# Patient Record
Sex: Female | Born: 1975 | Race: White | Hispanic: No | Marital: Married | Smoking: Never smoker
Health system: Southern US, Community
[De-identification: ages and names within clinical notes are randomized; demographics above are authoritative.]

## PROBLEM LIST (undated history)

## (undated) DIAGNOSIS — C801 Malignant (primary) neoplasm, unspecified: Secondary | ICD-10-CM

---

## 2015-09-07 NOTE — Patient Instructions (Signed)
Katherine Walker  09/07/2015   Your procedure is scheduled on: September 12, 2015  Report to [facility]  Entrance take [facility]  elevators to 3rd floor to  [place] at 9:15 AM.  Call this number if you have problems the morning of surgery [phone]   Remember: ONLY 1 PERSON MAY GO WITH YOU TO SHORT STAY TO GET  READY MORNING OF [place].  Do not eat food or drink liquids :After Midnight.     Take these medicines the morning of surgery with A SIP OF WATER: None                                You may not have any metal on your body including hair pins and              piercings  Do not wear jewelry, make-up, lotions, powders or perfumes, deodorant             Do not wear nail polish.  Do not shave  48 hours prior to surgery.            Do not bring valuables to the hospital. [place].  Contacts, dentures or bridgework may not be worn into surgery.  Leave suitcase in the car. After surgery it may be brought to your room.       Special Instructions: coughing and deep breathing exercises, leg exercises              Please read over the following fact sheets you were given: _____________________________________________________________________             [facility] - Preparing for Surgery Before surgery, you can play an important role.  Because skin is not sterile, your skin needs to be as free of germs as possible.  You can reduce the number of germs on your skin by washing with CHG (chlorahexidine gluconate) soap before surgery.  CHG is an antiseptic cleaner which kills germs and bonds with the skin to continue killing germs even after washing. Please DO NOT use if you have an allergy to CHG or antibacterial soaps.  If your skin becomes reddened/irritated stop using the CHG and inform your nurse when you arrive at Short Stay. Do not shave (including legs and underarms) for at least 48 hours prior to  the first CHG shower.  You may shave your face/neck. Please follow these instructions carefully:  1.  Shower with CHG Soap the night before surgery and the  morning of Surgery.  2.  If you choose to wash your hair, wash your hair first as usual with your  normal  shampoo.  3.  After you shampoo, rinse your hair and body thoroughly to remove the  shampoo.                           4.  Use CHG as you would any other liquid soap.  You can apply chg directly  to the skin and wash                       Gently with a scrungie or clean washcloth.  5.  Apply the  CHG Soap to your body ONLY FROM THE NECK DOWN.   Do not use on face/ open                           Wound or open sores. Avoid contact with eyes, ears mouth and genitals (private parts).                       Wash face,  Genitals (private parts) with your normal soap.             6.  Wash thoroughly, paying special attention to the area where your surgery  will be performed.  7.  Thoroughly rinse your body with warm water from the neck down.  8.  DO NOT shower/wash with your normal soap after using and rinsing off  the CHG Soap.                9.  Pat yourself dry with a clean towel.            10.  Wear clean pajamas.            11.  Place clean sheets on your bed the night of your first shower and do not  sleep with pets. Day of Surgery : Do not apply any lotions/deodorants the morning of surgery.  Please wear clean clothes to the hospital/surgery center.  FAILURE TO FOLLOW THESE INSTRUCTIONS MAY RESULT IN THE CANCELLATION OF YOUR SURGERY PATIENT SIGNATURE_________________________________  NURSE SIGNATURE__________________________________  ________________________________________________________________________

## 2015-09-08 ENCOUNTER — Ambulatory Visit: Payer: Self-pay | Admitting: Surgery

## 2015-09-08 NOTE — H&P (Signed)
[facility]  Meta Hatchet. Katherine Walker DOB: [birth year] Married / Language: English / Race: White Female  History of Present Illness  The patient is a 40 year old female who presents with a thyroid nodule.  Patient is referred by Dr. Francetta Found for thyroid neoplasm of uncertain significance. Patient noted on self-examination a mass in the anterior neck in January 2017. She was seen by her primary care physician and underwent an ultrasound. This showed bilateral thyroid nodules with a dominant nodule in the right measuring 3.2 cm, complex, containing microcalcifications. Subsequent fine-needle aspiration biopsy was performed and showed a follicular lesion of undetermined significance. Additional testing has not yet been performed. Patient has no prior history of head or neck surgery. She has never been on thyroid medication. There is no family history of thyroid disease and specifically no history of thyroid cancer. There is no family history of other endocrine neoplasms. Patient denies any compressive symptoms. She denies tremors or palpitations.   Other Problems No pertinent past medical history  Past Surgical History  No pertinent past surgical history  Diagnostic Studies History  Colonoscopy never Mammogram 1-3 years ago Pap Smear 1-5 years ago  Allergies No Known Drug Allergies02/20/2017  Medication History  No Current Medications Medications Reconciled  Social History Alcohol use Occasional alcohol use. Caffeine use Coffee, Tea. No drug use Tobacco use Never smoker.  Family History  Breast Cancer Family Members In General. Cancer Family Members In General. Diabetes Mellitus Family Members In General. Hypertension Family Members In General.  Pregnancy / Birth History Age at menarche 45 years. Contraceptive History Oral contraceptives. Gravida 3 Maternal age 34-25 Para 3 Regular periods  Review of  Systems General Not Present- Appetite Loss, Chills, Fatigue, Fever, Night Sweats, Weight Gain and Weight Loss. Skin Not Present- Change in Wart/Mole, Dryness, Hives, Jaundice, New Lesions, Non-Healing Wounds, Rash and Ulcer. HEENT Not Present- Earache, Hearing Loss, Hoarseness, Nose Bleed, Oral Ulcers, Ringing in the Ears, Seasonal Allergies, Sinus Pain, Sore Throat, Visual Disturbances, Wears glasses/contact lenses and Yellow Eyes. Respiratory Not Present- Bloody sputum, Chronic Cough, Difficulty Breathing, Snoring and Wheezing. Breast Not Present- Breast Mass, Breast Pain, Nipple Discharge and Skin Changes. Cardiovascular Not Present- Chest Pain, Difficulty Breathing Lying Down, Leg Cramps, Palpitations, Rapid Heart Rate, Shortness of Breath and Swelling of Extremities. Gastrointestinal Not Present- Abdominal Pain, Bloating, Bloody Stool, Change in Bowel Habits, Chronic diarrhea, Constipation, Difficulty Swallowing, Excessive gas, Gets full quickly at meals, Hemorrhoids, Indigestion, Nausea, Rectal Pain and Vomiting. Female Genitourinary Not Present- Frequency, Nocturia, Painful Urination, Pelvic Pain and Urgency. Musculoskeletal Not Present- Back Pain, Joint Pain, Joint Stiffness, Muscle Pain, Muscle Weakness and Swelling of Extremities. Neurological Not Present- Decreased Memory, Fainting, Headaches, Numbness, Seizures, Tingling, Tremor, Trouble walking and Weakness. Psychiatric Not Present- Anxiety, Bipolar, Change in Sleep Pattern, Depression, Fearful and Frequent crying. Endocrine Not Present- Cold Intolerance, Excessive Hunger, Hair Changes, Heat Intolerance, Hot flashes and New Diabetes. Hematology Not Present- Easy Bruising, Excessive bleeding, Gland problems, HIV and Persistent Infections.  Vitals Weight: 157.8 lb Height: 65in Body Surface Area: 1.79 m Body Mass Index: 26.26 kg/m  Temp.: 97.34F  Pulse: 66 (Regular)  BP: 134/86 (Sitting, Left Arm, Standard)   Physical  Exam  General - appears comfortable, no distress; not diaphorectic  HEENT - normocephalic; sclerae clear, gaze conjugate; mucous membranes moist, dentition good; voice normal  Neck - asymmetric on extension; no palpable anterior or posterior cervical adenopathy; dominant mass anterior right thyroid lobe, approximately 3 cm in  size, smooth, mobile, nontender; left thyroid lobe without dominant mass, slight nodularity, nontender  Chest - clear bilaterally without rhonchi, rales, or wheeze  Cor - regular rhythm with normal rate; no significant murmur  Ext - non-tender without significant edema or lymphedema  Neuro - grossly intact; no tremor   Assessment & Plan  NEOPLASM OF UNCERTAIN BEHAVIOR OF THYROID GLAND (D44.0) MULTIPLE THYROID NODULES (E04.2)  Patient is referred by her endocrinologist for evaluation of multiple thyroid nodules with a dominant nodule in the right thyroid lobe representing a thyroid neoplasm of undetermined significance. Patient is provided with written literature on thyroid surgery to review at home.  We discussed options for management including proceeding with a repeat biopsy for molecular genetic studies. Alternatively, we could proceed with thyroidectomy. We discussed the procedure of total thyroidectomy at length. We discussed risks and benefits of the procedure including risk of injury to recurrent laryngeal nerves and the parathyroid glands. We discussed the hospital stay to be anticipated. We discussed the need for lifelong thyroid hormone replacement. We discussed the potential need for radioactive iodine treatment. We discussed the location of the surgical incision and the cosmetic results to be anticipated. Patient and her husband understand and wish to proceed with surgery in the near future.  The risks and benefits of the procedure have been discussed at length with the patient. The patient understands the proposed procedure, potential alternative  treatments, and the course of recovery to be expected. All of the patient's questions have been answered at this time. The patient wishes to proceed with surgery.  Earnstine Regal, MD, [facility] Office: [phone]

## 2015-09-08 NOTE — Progress Notes (Signed)
Patient needs orders in epic by 400 pm today or will be moved to depot. Pre op is 09-11-15, surgery 09-12-15. Thanks for your help.

## 2015-09-10 ENCOUNTER — Encounter (HOSPITAL_COMMUNITY): Payer: Self-pay | Admitting: Surgery

## 2015-09-10 DIAGNOSIS — D440 Neoplasm of uncertain behavior of thyroid gland: Secondary | ICD-10-CM | POA: Diagnosis present

## 2015-09-10 DIAGNOSIS — E042 Nontoxic multinodular goiter: Secondary | ICD-10-CM | POA: Diagnosis present

## 2015-09-10 NOTE — H&P (Signed)
[facility]  Katherine Walker. Thrall DOB: 05/22/1976 Married / Language: English / Race: White Female  History of Present Illness  The patient is a 40 year old female who presents with a thyroid nodule.  Patient is referred by Katherine Walker for thyroid neoplasm of uncertain significance. Patient noted on self-examination a mass in the anterior neck in January 2017. She was seen by her primary care physician and underwent an ultrasound. This showed bilateral thyroid nodules with a dominant nodule in the right measuring 3.2 cm, complex, containing microcalcifications. Subsequent fine-needle aspiration biopsy was performed and showed a follicular lesion of undetermined significance. Additional testing has not yet been performed. Patient has no prior history of head or neck Walker. She has never been on thyroid medication. There is no family history of thyroid disease and specifically no history of thyroid cancer. There is no family history of other endocrine neoplasms. Patient denies any compressive symptoms. She denies tremors or palpitations.   Other Problems No pertinent past medical history  Past Surgical History No pertinent past surgical history  Diagnostic Studies History Colonoscopy never Mammogram 1-3 years ago Pap Smear 1-5 years ago  Allergies No Known Drug Allergies02/20/2017  Medication History No Current Medications Medications Reconciled  Social History Alcohol use Occasional alcohol use. Caffeine use Coffee, Tea. No drug use Tobacco use Never smoker.  Family History Breast Cancer Family Members In General. Cancer Family Members In General. Diabetes Mellitus Family Members In General. Hypertension Family Members In General.  Pregnancy / Birth History Age at menarche 71 years. Contraceptive History Oral contraceptives. Gravida 3 Maternal age 87-25 Para 3 Regular periods  Review of  Systems General Not Present- Appetite Loss, Chills, Fatigue, Fever, Night Sweats, Weight Gain and Weight Loss. Skin Not Present- Change in Wart/Mole, Dryness, Hives, Jaundice, New Lesions, Non-Healing Wounds, Rash and Ulcer. HEENT Not Present- Earache, Hearing Loss, Hoarseness, Nose Bleed, Oral Ulcers, Ringing in the Ears, Seasonal Allergies, Sinus Pain, Sore Throat, Visual Disturbances, Wears glasses/contact lenses and Yellow Eyes. Respiratory Not Present- Bloody sputum, Chronic Cough, Difficulty Breathing, Snoring and Wheezing. Breast Not Present- Breast Mass, Breast Pain, Nipple Discharge and Skin Changes. Cardiovascular Not Present- Chest Pain, Difficulty Breathing Lying Down, Leg Cramps, Palpitations, Rapid Heart Rate, Shortness of Breath and Swelling of Extremities. Gastrointestinal Not Present- Abdominal Pain, Bloating, Bloody Stool, Change in Bowel Habits, Chronic diarrhea, Constipation, Difficulty Swallowing, Excessive gas, Gets full quickly at meals, Hemorrhoids, Indigestion, Nausea, Rectal Pain and Vomiting. Female Genitourinary Not Present- Frequency, Nocturia, Painful Urination, Pelvic Pain and Urgency. Musculoskeletal Not Present- Back Pain, Joint Pain, Joint Stiffness, Muscle Pain, Muscle Weakness and Swelling of Extremities. Neurological Not Present- Decreased Memory, Fainting, Headaches, Numbness, Seizures, Tingling, Tremor, Trouble walking and Weakness. Psychiatric Not Present- Anxiety, Bipolar, Change in Sleep Pattern, Depression, Fearful and Frequent crying. Endocrine Not Present- Cold Intolerance, Excessive Hunger, Hair Changes, Heat Intolerance, Hot flashes and New Diabetes. Hematology Not Present- Easy Bruising, Excessive bleeding, Gland problems, HIV and Persistent Infections.  Vitals  Weight: 157.8 lb Height: 65in Body Surface Area: 1.79 m Body Mass Index: 26.26 kg/m  Temp.: 97.5F  Pulse: 66 (Regular)  BP: 134/86 (Sitting, Left Arm, Standard)  Physical  Exam  General - appears comfortable, no distress; not diaphorectic  HEENT - normocephalic; sclerae clear, gaze conjugate; mucous membranes moist, dentition good; voice normal  Neck - asymmetric on extension; no palpable anterior or posterior cervical adenopathy; dominant mass anterior right thyroid lobe, approximately 3 cm in size, smooth, mobile, nontender;  left thyroid lobe without dominant mass, slight nodularity, nontender  Chest - clear bilaterally without rhonchi, rales, or wheeze  Cor - regular rhythm with normal rate; no significant murmur  Ext - non-tender without significant edema or lymphedema  Neuro - grossly intact; no tremor   Assessment & Plan  NEOPLASM OF UNCERTAIN BEHAVIOR OF THYROID GLAND (D44.0) MULTIPLE THYROID NODULES (E04.2)  Patient is referred by her endocrinologist for evaluation of multiple thyroid nodules with a dominant nodule in the right thyroid lobe representing a thyroid neoplasm of undetermined significance. Patient is provided with written literature on thyroid Walker to review at home.  We discussed options for management including proceeding with a repeat biopsy for molecular genetic studies. Alternatively, we could proceed with thyroidectomy. We discussed the procedure of total thyroidectomy at length. We discussed risks and benefits of the procedure including risk of injury to recurrent laryngeal nerves and the parathyroid glands. We discussed the hospital stay to be anticipated. We discussed the need for lifelong thyroid hormone replacement. We discussed the potential need for radioactive iodine treatment. We discussed the location of the surgical incision and the cosmetic results to be anticipated. Patient and her husband understand and wish to proceed with Walker in the near future.  The risks and benefits of the procedure have been discussed at length with the patient. The patient understands the proposed procedure, potential alternative  treatments, and the course of recovery to be expected. All of the patient's questions have been answered at this time. The patient wishes to proceed with Walker.  Katherine Regal, MD, [facility] Office: [phone]

## 2015-09-11 ENCOUNTER — Ambulatory Visit (HOSPITAL_COMMUNITY)
Admission: RE | Admit: 2015-09-11 | Discharge: 2015-09-11 | Disposition: A | Discharge status: Home / Self Care | Payer: 59 | Source: Ambulatory Visit | Attending: Anesthesiology | Admitting: Anesthesiology

## 2015-09-11 ENCOUNTER — Encounter (HOSPITAL_COMMUNITY): Payer: Self-pay

## 2015-09-11 ENCOUNTER — Encounter (HOSPITAL_COMMUNITY)
Admission: RE | Admit: 2015-09-11 | Discharge: 2015-09-11 | Disposition: A | Discharge status: Home / Self Care | Payer: 59 | Source: Ambulatory Visit | Attending: Surgery | Admitting: Surgery

## 2015-09-11 DIAGNOSIS — Z01818 Encounter for other preprocedural examination: Secondary | ICD-10-CM | POA: Diagnosis not present

## 2015-09-11 HISTORY — DX: Malignant (primary) neoplasm, unspecified: C80.1

## 2015-09-11 LAB — HCG, SERUM, QUALITATIVE: PREG SERUM: NEGATIVE

## 2015-09-11 NOTE — Progress Notes (Signed)
08-22-15 - CBC & BMP labs - in chart

## 2015-09-12 ENCOUNTER — Observation Stay (HOSPITAL_COMMUNITY)
Admission: RE | Admit: 2015-09-12 | Discharge: 2015-09-13 | Disposition: A | Discharge status: Home / Self Care | Payer: 59 | Source: Ambulatory Visit | Attending: Surgery | Admitting: Surgery

## 2015-09-12 ENCOUNTER — Ambulatory Visit (HOSPITAL_COMMUNITY): Payer: 59 | Admitting: Certified Registered"

## 2015-09-12 ENCOUNTER — Encounter (HOSPITAL_COMMUNITY)
Admission: RE | Disposition: A | Discharge status: Home / Self Care | Payer: Self-pay | Source: Ambulatory Visit | Attending: Surgery

## 2015-09-12 ENCOUNTER — Encounter (HOSPITAL_COMMUNITY): Payer: Self-pay | Admitting: *Deleted

## 2015-09-12 DIAGNOSIS — E042 Nontoxic multinodular goiter: Secondary | ICD-10-CM | POA: Diagnosis present

## 2015-09-12 DIAGNOSIS — D440 Neoplasm of uncertain behavior of thyroid gland: Secondary | ICD-10-CM | POA: Diagnosis present

## 2015-09-12 DIAGNOSIS — C73 Malignant neoplasm of thyroid gland: Secondary | ICD-10-CM | POA: Diagnosis not present

## 2015-09-12 HISTORY — PX: THYROIDECTOMY: SHX17

## 2015-09-12 SURGERY — THYROIDECTOMY
Anesthesia: General

## 2015-09-12 MED ORDER — PROMETHAZINE HCL 25 MG/ML IJ SOLN: 6.2500 mg | INTRAMUSCULAR | Status: DC | PRN

## 2015-09-12 MED ORDER — ROCURONIUM BROMIDE 100 MG/10ML IV SOLN
INTRAVENOUS | Status: AC
  Filled 2015-09-12: qty 1

## 2015-09-12 MED ORDER — HYDROMORPHONE HCL 1 MG/ML IJ SOLN
0.2500 mg | INTRAMUSCULAR | Status: DC | PRN
  Administered 2015-09-12 (×2): 0.5 mg via INTRAVENOUS

## 2015-09-12 MED ORDER — ONDANSETRON HCL 4 MG/2ML IJ SOLN
4.0000 mg | Freq: Four times a day (QID) | INTRAMUSCULAR | Status: DC | PRN
  Filled 2015-09-12: qty 2

## 2015-09-12 MED ORDER — ACETAMINOPHEN 10 MG/ML IV SOLN
INTRAVENOUS | Status: DC | PRN
  Administered 2015-09-12: 1000 mg via INTRAVENOUS

## 2015-09-12 MED ORDER — ONDANSETRON HCL 4 MG/2ML IJ SOLN
INTRAMUSCULAR | Status: AC
  Filled 2015-09-12: qty 4

## 2015-09-12 MED ORDER — ROCURONIUM BROMIDE 100 MG/10ML IV SOLN
INTRAVENOUS | Status: DC | PRN
  Administered 2015-09-12: 20 mg via INTRAVENOUS
  Administered 2015-09-12: 30 mg via INTRAVENOUS
  Administered 2015-09-12: 20 mg via INTRAVENOUS

## 2015-09-12 MED ORDER — KCL IN DEXTROSE-NACL 20-5-0.45 MEQ/L-%-% IV SOLN
INTRAVENOUS | Status: DC
  Administered 2015-09-12 – 2015-09-13 (×2): via INTRAVENOUS
  Filled 2015-09-12 (×2): qty 1000

## 2015-09-12 MED ORDER — PROPOFOL 10 MG/ML IV BOLUS
INTRAVENOUS | Status: AC
  Filled 2015-09-12: qty 20

## 2015-09-12 MED ORDER — DEXAMETHASONE SODIUM PHOSPHATE 10 MG/ML IJ SOLN
INTRAMUSCULAR | Status: DC | PRN
  Administered 2015-09-12: 10 mg via INTRAVENOUS

## 2015-09-12 MED ORDER — SUGAMMADEX SODIUM 200 MG/2ML IV SOLN
INTRAVENOUS | Status: DC | PRN
  Administered 2015-09-12: 150 mg via INTRAVENOUS

## 2015-09-12 MED ORDER — HYDROCODONE-ACETAMINOPHEN 5-325 MG PO TABS: 1.0000 | ORAL_TABLET | ORAL | Status: DC | PRN

## 2015-09-12 MED ORDER — ACETAMINOPHEN 325 MG PO TABS: 650.0000 mg | ORAL_TABLET | Freq: Four times a day (QID) | ORAL | Status: DC | PRN

## 2015-09-12 MED ORDER — LIDOCAINE HCL (CARDIAC) 20 MG/ML IV SOLN
INTRAVENOUS | Status: DC | PRN
  Administered 2015-09-12: 100 mg via INTRAVENOUS

## 2015-09-12 MED ORDER — MIDAZOLAM HCL 5 MG/5ML IJ SOLN
INTRAMUSCULAR | Status: DC | PRN
  Administered 2015-09-12: 2 mg via INTRAVENOUS

## 2015-09-12 MED ORDER — PROPOFOL 10 MG/ML IV BOLUS
INTRAVENOUS | Status: DC | PRN
  Administered 2015-09-12: 170 mg via INTRAVENOUS

## 2015-09-12 MED ORDER — 0.9 % SODIUM CHLORIDE (POUR BTL) OPTIME
TOPICAL | Status: DC | PRN
  Administered 2015-09-12: 1000 mL

## 2015-09-12 MED ORDER — FENTANYL CITRATE (PF) 250 MCG/5ML IJ SOLN
INTRAMUSCULAR | Status: AC
  Filled 2015-09-12: qty 5

## 2015-09-12 MED ORDER — FENTANYL CITRATE (PF) 100 MCG/2ML IJ SOLN
INTRAMUSCULAR | Status: DC | PRN
  Administered 2015-09-12 (×3): 50 ug via INTRAVENOUS
  Administered 2015-09-12 (×2): 25 ug via INTRAVENOUS
  Administered 2015-09-12 (×3): 50 ug via INTRAVENOUS

## 2015-09-12 MED ORDER — DEXAMETHASONE SODIUM PHOSPHATE 10 MG/ML IJ SOLN
INTRAMUSCULAR | Status: AC
  Filled 2015-09-12: qty 1

## 2015-09-12 MED ORDER — ONDANSETRON HCL 4 MG/2ML IJ SOLN
INTRAMUSCULAR | Status: DC | PRN
  Administered 2015-09-12 (×2): 2 mg via INTRAVENOUS
  Administered 2015-09-12: 4 mg via INTRAVENOUS

## 2015-09-12 MED ORDER — FENTANYL CITRATE (PF) 100 MCG/2ML IJ SOLN
INTRAMUSCULAR | Status: AC
  Filled 2015-09-12: qty 2

## 2015-09-12 MED ORDER — HYDROMORPHONE HCL 1 MG/ML IJ SOLN
1.0000 mg | INTRAMUSCULAR | Status: DC | PRN
  Administered 2015-09-12: 1 mg via INTRAVENOUS
  Filled 2015-09-12: qty 1

## 2015-09-12 MED ORDER — SUCCINYLCHOLINE CHLORIDE 20 MG/ML IJ SOLN
INTRAMUSCULAR | Status: DC | PRN
  Administered 2015-09-12: 100 mg via INTRAVENOUS

## 2015-09-12 MED ORDER — MIDAZOLAM HCL 2 MG/2ML IJ SOLN
INTRAMUSCULAR | Status: AC
  Filled 2015-09-12: qty 2

## 2015-09-12 MED ORDER — LACTATED RINGERS IV SOLN
INTRAVENOUS | Status: DC
  Administered 2015-09-12: 13:00:00 via INTRAVENOUS
  Administered 2015-09-12: 1000 mL via INTRAVENOUS

## 2015-09-12 MED ORDER — PROMETHAZINE HCL 25 MG/ML IJ SOLN
12.5000 mg | INTRAMUSCULAR | Status: DC | PRN
  Administered 2015-09-12: 12.5 mg via INTRAVENOUS
  Filled 2015-09-12: qty 1

## 2015-09-12 MED ORDER — ACETAMINOPHEN 650 MG RE SUPP: 650.0000 mg | Freq: Four times a day (QID) | RECTAL | Status: DC | PRN

## 2015-09-12 MED ORDER — CEFAZOLIN SODIUM-DEXTROSE 2-3 GM-% IV SOLR
INTRAVENOUS | Status: AC
  Filled 2015-09-12: qty 50

## 2015-09-12 MED ORDER — HYDROMORPHONE HCL 1 MG/ML IJ SOLN
INTRAMUSCULAR | Status: AC
  Filled 2015-09-12: qty 1

## 2015-09-12 MED ORDER — LIDOCAINE HCL (CARDIAC) 20 MG/ML IV SOLN
INTRAVENOUS | Status: AC
  Filled 2015-09-12: qty 5

## 2015-09-12 MED ORDER — CEFAZOLIN SODIUM-DEXTROSE 2-3 GM-% IV SOLR
2.0000 g | INTRAVENOUS | Status: AC
  Administered 2015-09-12: 2 g via INTRAVENOUS

## 2015-09-12 MED ORDER — ONDANSETRON 4 MG PO TBDP: 4.0000 mg | ORAL_TABLET | Freq: Four times a day (QID) | ORAL | Status: DC | PRN

## 2015-09-12 MED ORDER — SUGAMMADEX SODIUM 200 MG/2ML IV SOLN
INTRAVENOUS | Status: AC
  Filled 2015-09-12: qty 2

## 2015-09-12 MED ORDER — ACETAMINOPHEN 10 MG/ML IV SOLN
INTRAVENOUS | Status: AC
  Filled 2015-09-12: qty 100

## 2015-09-12 MED ORDER — CALCIUM CARBONATE 1250 (500 CA) MG PO TABS
2.0000 | ORAL_TABLET | Freq: Three times a day (TID) | ORAL | Status: DC
  Administered 2015-09-12 – 2015-09-13 (×2): 1000 mg via ORAL
  Filled 2015-09-12 (×6): qty 2

## 2015-09-12 SURGICAL SUPPLY — 38 items
ATTRACTOMAT 16X20 MAGNETIC DRP (DRAPES) ×2 IMPLANT
BENZOIN TINCTURE PRP APPL 2/3 (GAUZE/BANDAGES/DRESSINGS) ×2 IMPLANT
BLADE HEX COATED 2.75 (ELECTRODE) ×2 IMPLANT
BLADE SURG 15 STRL LF DISP TIS (BLADE) ×1 IMPLANT
BLADE SURG 15 STRL SS (BLADE) ×1
CHLORAPREP W/TINT 26ML (MISCELLANEOUS) ×2 IMPLANT
CLIP TI MEDIUM 6 (CLIP) ×10 IMPLANT
CLIP TI WIDE RED SMALL 6 (CLIP) ×6 IMPLANT
COVER SURGICAL LIGHT HANDLE (MISCELLANEOUS) ×2 IMPLANT
DISSECTOR ROUND CHERRY 3/8 STR (MISCELLANEOUS) IMPLANT
DRAPE LAPAROTOMY T 98X78 PEDS (DRAPES) ×2 IMPLANT
DRESSING SURGICEL FIBRLLR 1X2 (HEMOSTASIS) ×1 IMPLANT
DRSG SURGICEL FIBRILLAR 1X2 (HEMOSTASIS) ×2
ELECT PENCIL ROCKER SW 15FT (MISCELLANEOUS) ×2 IMPLANT
ELECT REM PT RETURN 9FT ADLT (ELECTROSURGICAL) ×2
ELECTRODE REM PT RTRN 9FT ADLT (ELECTROSURGICAL) ×1 IMPLANT
GAUZE SPONGE 4X4 12PLY STRL (GAUZE/BANDAGES/DRESSINGS) IMPLANT
GAUZE SPONGE 4X4 16PLY XRAY LF (GAUZE/BANDAGES/DRESSINGS) ×2 IMPLANT
GLOVE SURG ORTHO 8.0 STRL STRW (GLOVE) ×2 IMPLANT
GOWN STRL REUS W/TWL XL LVL3 (GOWN DISPOSABLE) ×4 IMPLANT
ILLUMINATOR WAVEGUIDE N/F (MISCELLANEOUS) ×2 IMPLANT
KIT BASIN OR (CUSTOM PROCEDURE TRAY) ×2 IMPLANT
LIQUID BAND (GAUZE/BANDAGES/DRESSINGS) ×2 IMPLANT
PACK BASIC VI WITH GOWN DISP (CUSTOM PROCEDURE TRAY) ×2 IMPLANT
SHEARS HARMONIC 9CM CVD (BLADE) ×2 IMPLANT
STAPLER VISISTAT 35W (STAPLE) IMPLANT
STRIP CLOSURE SKIN 1/2X4 (GAUZE/BANDAGES/DRESSINGS) ×2 IMPLANT
SUT MNCRL AB 4-0 PS2 18 (SUTURE) ×2 IMPLANT
SUT SILK 2 0 (SUTURE)
SUT SILK 2-0 18XBRD TIE 12 (SUTURE) IMPLANT
SUT SILK 3 0 (SUTURE)
SUT SILK 3-0 18XBRD TIE 12 (SUTURE) IMPLANT
SUT VIC AB 3-0 SH 18 (SUTURE) ×4 IMPLANT
SYR BULB IRRIGATION 50ML (SYRINGE) ×2 IMPLANT
TAPE CLOTH SURG 4X10 WHT LF (GAUZE/BANDAGES/DRESSINGS) ×2 IMPLANT
TOWEL OR 17X26 10 PK STRL BLUE (TOWEL DISPOSABLE) ×2 IMPLANT
TOWEL OR NON WOVEN STRL DISP B (DISPOSABLE) ×2 IMPLANT
YANKAUER SUCT BULB TIP 10FT TU (MISCELLANEOUS) ×2 IMPLANT

## 2015-09-12 NOTE — Anesthesia Preprocedure Evaluation (Addendum)
Anesthesia Evaluation  Patient identified by MRN, date of birth, ID band Patient awake    Reviewed: Allergy & Precautions, NPO status , Patient's Chart, lab work & pertinent test results  Airway Mallampati: II  TM Distance: >3 FB Neck ROM: Full    Dental no notable dental hx.    Pulmonary neg pulmonary ROS,    Pulmonary exam normal breath sounds clear to auscultation       Cardiovascular negative cardio ROS Normal cardiovascular exam Rhythm:Regular Rate:Normal     Neuro/Psych negative neurological ROS  negative psych ROS   GI/Hepatic negative GI ROS, Neg liver ROS,   Endo/Other  negative endocrine ROS  Renal/GU negative Renal ROS  negative genitourinary   Musculoskeletal negative musculoskeletal ROS (+)   Abdominal   Peds negative pediatric ROS (+)  Hematology negative hematology ROS (+)   Anesthesia Other Findings   Reproductive/Obstetrics negative OB ROS                            Anesthesia Physical Anesthesia Plan  ASA: II  Anesthesia Plan: General   Post-op Pain Management:    Induction: Intravenous  Airway Management Planned: Oral ETT  Additional Equipment:   Intra-op Plan:   Post-operative Plan: Extubation in OR  Informed Consent: I have reviewed the patients History and Physical, chart, labs and discussed the procedure including the risks, benefits and alternatives for the proposed anesthesia with the patient or authorized representative who has indicated his/her understanding and acceptance.   Dental advisory given  Plan Discussed with: CRNA and Surgeon  Anesthesia Plan Comments:         Anesthesia Quick Evaluation  

## 2015-09-12 NOTE — Interval H&P Note (Signed)
History and Physical Interval Note:  09/12/2015 11:08 AM  Katherine Walker  has presented today for surgery, with the diagnosis of Thryoid neoplasm of uncertain behavior.  The various methods of treatment have been discussed with the patient and family. After consideration of risks, benefits and other options for treatment, the patient has consented to    Procedure(s): TOTAL THYROIDECTOMY (N/A) as a surgical intervention .    The patient's history has been reviewed, patient examined, no change in status, stable for surgery.  I have reviewed the patient's chart and labs.  Questions were answered to the patient's satisfaction.    Earnstine Regal, MD, [facility] Office: [place]

## 2015-09-12 NOTE — Transfer of Care (Signed)
Immediate Anesthesia Transfer of Care Note  Patient: Katherine Walker  Procedure(s) Performed: Procedure(s): TOTAL THYROIDECTOMY (N/A)  Patient Location: PACU  Anesthesia Type:General  Level of Consciousness:  sedated, patient cooperative and responds to stimulation  Airway & Oxygen Therapy:Patient Spontanous Breathing and Patient connected to face mask oxgen  Post-op Assessment:  Report given to PACU RN and Post -op Vital signs reviewed and stable  Post vital signs:  Reviewed and stable  Last Vitals:  Filed Vitals:   09/12/15 0915  BP: 121/79  Pulse: 90  Temp: 36.7 C  Resp: 16    Complications: No apparent anesthesia complications

## 2015-09-12 NOTE — Anesthesia Postprocedure Evaluation (Signed)
Anesthesia Post Note  Patient: Katherine Walker  Procedure(s) Performed: Procedure(s) (LRB): TOTAL THYROIDECTOMY (N/A)  Patient location during evaluation: PACU Anesthesia Type: General Level of consciousness: awake and alert Pain management: pain level controlled Vital Signs Assessment: post-procedure vital signs reviewed and stable Respiratory status: spontaneous breathing, nonlabored ventilation, respiratory function stable and patient connected to nasal cannula oxygen Cardiovascular status: blood pressure returned to baseline and stable Postop Assessment: no signs of nausea or vomiting Anesthetic complications: no    Last Vitals:  Filed Vitals:   09/12/15 1338 09/12/15 1345  BP: 130/84 119/70  Pulse: 79 79  Temp: 36.5 C   Resp: 16 16    Last Pain: There were no vitals filed for this visit.               Stafford Riviera S

## 2015-09-12 NOTE — Anesthesia Procedure Notes (Signed)
Procedure Name: Intubation Date/Time: 09/12/2015 11:52 AM Performed by: Carleene Cooper A Pre-anesthesia Checklist: Patient identified, Timeout performed, Emergency Drugs available, Suction available and Patient being monitored Patient Re-evaluated:Patient Re-evaluated prior to inductionOxygen Delivery Method: Circle system utilized Preoxygenation: Pre-oxygenation with 100% oxygen Intubation Type: IV induction Ventilation: Mask ventilation without difficulty Laryngoscope Size: Mac and 4 Grade View: Grade I Tube type: Oral Tube size: 7.0 mm Number of attempts: 1 Airway Equipment and Method: Stylet Placement Confirmation: ETT inserted through vocal cords under direct vision,  breath sounds checked- equal and bilateral and positive ETCO2 Secured at: 21 cm Tube secured with: Tape Dental Injury: Teeth and Oropharynx as per pre-operative assessment

## 2015-09-12 NOTE — Op Note (Signed)
Procedure Note  Pre-operative Diagnosis:  Right thyroid neoplasm of uncertain behavior  Post-operative Diagnosis:  same  Surgeon:  Earnstine Regal, MD, FACS   Procedure:  Total thyroidectomy  Anesthesia:  General  Estimated Blood Loss:  minimal  Drains: none         Specimen: thyroid to pathology  Indications:  The patient is a 40 year old female who presents with a thyroid nodule.  Patient is referred by Dr. Francetta Found for thyroid neoplasm of uncertain significance. Patient noted on self-examination a mass in the anterior neck in January 2017. She was seen by her primary care physician and underwent an ultrasound. This showed bilateral thyroid nodules with a dominant nodule in the right measuring 3.2 cm, complex, containing microcalcifications. Subsequent fine-needle aspiration biopsy was performed and showed a follicular lesion of undetermined significance.  Procedure Details: Procedure was done in OR #1 at the [facility].  The patient was brought to the operating room and placed in a supine position on the operating room table.  Following administration of general anesthesia, the patient was positioned and then prepped and draped in the usual aseptic fashion.  After ascertaining that an adequate level of anesthesia had been achieved, a Kocher incision was made with #15 blade.  Dissection was carried through subcutaneous tissues and platysma. Hemostasis was achieved with the electrocautery.  Skin flaps were elevated cephalad and caudad from the thyroid notch to the sternal notch.  The Mahorner self-retaining retractor was placed for exposure.  Strap muscles were incised in the midline and dissection was begun on the left side.  Strap muscles were reflected laterally.  Left thyroid lobe was normal without nodules.  The left lobe was gently mobilized with blunt dissection.  Superior pole vessels were dissected out and divided individually between small and medium Ligaclips with  the Harmonic scalpel.  The thyroid lobe was rolled anteriorly.  Branches of the inferior thyroid artery were divided between small Ligaclips with the Harmonic scalpel.  Inferior venous tributaries were divided between Ligaclips.  Both the superior and inferior parathyroid glands were identified and preserved on their vascular pedicles.  The recurrent laryngeal nerve was identified and preserved along its course.  The ligament of Gwenlyn Found was released with the electrocautery and the gland was mobilized onto the anterior trachea. Isthmus was mobilized across the midline.  There was a very small remnant of the pyramidal lobe present which was dissected off the thyroid cartilage and resected en bloc with the isthmus.  Dry pack was placed in the left neck.  Next, the right thyroid lobe was gently mobilized with blunt dissection.  Right thyroid lobe was enlarged due to a soft nodule in the inferior pole and extending into the isthmus.  Superior pole vessels were dissected out and divided between small and medium Ligaclips with the Harmonic scalpel.  Superior parathyroid was identified and preserved.  Inferior venous tributaries were divided between medium Ligaclips with the Harmonic scalpel.  The right thyroid lobe was rolled anteriorly and the branches of the inferior thyroid artery divided between small Ligaclips.  The right recurrent laryngeal nerve was identified and preserved along its course.  The ligament of Gwenlyn Found was released with the electrocautery.  The right thyroid lobe was mobilized onto the anterior trachea and the remainder of the thyroid was dissected off the anterior trachea and the thyroid was completely excised.  A suture was used to mark the right lobe. The entire thyroid gland was submitted to pathology for review.  The neck was palpated thoroughly and no significant adenopathy was identified bilaterally.  The neck was irrigated with warm saline.  Fibrillar was placed throughout the operative  field.  Strap muscles were reapproximated in the midline with interrupted 3-0 Vicryl sutures.  Platysma was closed with interrupted 3-0 Vicryl sutures.  Skin was closed with a running 4-0 Monocryl subcuticular suture.  Wound was washed and dried and benzoin and steri-strips were applied.  Dry gauze dressing was placed.  The patient was awakened from anesthesia and brought to the recovery room.  The patient tolerated the procedure well.   Earnstine Regal, MD, [facility] Office: [phone]

## 2015-09-13 ENCOUNTER — Encounter (HOSPITAL_COMMUNITY): Payer: Self-pay | Admitting: Surgery

## 2015-09-13 DIAGNOSIS — C73 Malignant neoplasm of thyroid gland: Secondary | ICD-10-CM | POA: Diagnosis not present

## 2015-09-13 LAB — BASIC METABOLIC PANEL
Anion gap: 10 (ref 5–15)
BUN: 8 mg/dL (ref 6–20)
CALCIUM: 9.4 mg/dL (ref 8.9–10.3)
CO2: 27 mmol/L (ref 22–32)
Chloride: 105 mmol/L (ref 101–111)
Creatinine, Ser: 0.78 mg/dL (ref 0.44–1.00)
GFR calc Af Amer: >60 mL/min (ref >60)
GLUCOSE: 153 mg/dL — AB (ref 65–99)
Potassium: 4.4 mmol/L (ref 3.5–5.1)
Sodium: 142 mmol/L (ref 135–145)

## 2015-09-13 MED ORDER — HYDROCODONE-ACETAMINOPHEN 5-325 MG PO TABS: 1.0000 | ORAL_TABLET | ORAL | Status: AC | PRN

## 2015-09-13 MED ORDER — CALCIUM CARBONATE 1250 (500 CA) MG PO TABS: 2.0000 | ORAL_TABLET | Freq: Two times a day (BID) | ORAL | Status: AC

## 2015-09-13 NOTE — Discharge Summary (Signed)
Physician Discharge Summary [facility]  Patient ID: Katherine Walker MRN: [id number] DOB/AGE: Aug 18, 1975 40 y.o.  Admit date: 09/12/2015 Discharge date: 09/13/2015  Admission Diagnoses:  Thyroid neoplasm of uncertain behavior   Discharge Diagnoses:  Principal Problem:   Neoplasm of uncertain behavior of thyroid gland Active Problems:   Multiple thyroid nodules   Discharged Condition: good  Hospital Course: Patient was admitted for observation following thyroid surgery.  Post op course was uncomplicated.  Pain was well controlled.  Tolerated diet.  Post op calcium level on morning following surgery was 9.4 mg/dl.  Patient was prepared for discharge home on POD#1.  Consults: None  Treatments: surgery: total thyroidectomy  Discharge Exam: Blood pressure 98/48, pulse 65, temperature 98.1 F (36.7 C), temperature source Oral, resp. rate 14, height 5\' 5"  (1.651 m), weight 73.029 kg (161 lb), last menstrual period 09/05/2015, SpO2 100 %. HEENT - clear Neck - wound dry and intact; minimal STS; voice normal Chest - clear bilaterally Cor - RRR  Disposition: Home  Discharge Instructions    Apply dressing    Complete by:  As directed   Apply light gauze dressing to wound before discharge home today.     Diet - low sodium heart healthy    Complete by:  As directed      Discharge instructions    Complete by:  As directed   [facility]  THYROID & PARATHYROID SURGERY:  POST-OP INSTRUCTIONS  Always review your discharge instruction sheet from the facility where your surgery was performed.  A prescription for pain medication may be given to you upon discharge.  Take your pain medication as prescribed.  If narcotic pain medicine is not needed, then you may take acetaminophen (Tylenol) or ibuprofen (Advil) as needed.  Take your usually prescribed medications unless otherwise directed.  If you need a refill on your pain medication, please contact  your pharmacy. They will contact our office to request authorization.  Prescriptions will not be processed by our office after 5 pm or on weekends.  Start with a light diet upon arrival home, such as soup and crackers or toast.  Be sure to drink plenty of fluids daily.  Resume your normal diet the day after surgery.  Most patients will experience some swelling and bruising on the chest and neck area.  Ice packs will help.  Swelling and bruising can take several days to resolve.   It is common to experience some constipation after surgery.  Increasing fluid intake and taking a stool softener will usually help or prevent this problem.  A mild laxative (Milk of Magnesia or Miralax) should be taken according to package directions if there has been no bowel movement after 48 hours.  You have steri-strips and a gauze dressing over your incision.  You may remove the gauze bandage on the second day after surgery, and you may shower at that time.  Leave your steri-strips (small skin tapes) in place directly over the incision.  These strips should remain on the skin for 5-7 days and then be removed.  You may get them wet in the shower and pat them dry.  You may resume regular (light) daily activities beginning the next day - such as daily self-care, walking, climbing stairs - gradually increasing activities as tolerated.  You may have sexual intercourse when it is comfortable.  Refrain from any heavy lifting or straining until approved by your doctor.  You may drive when you no longer are  taking prescription pain medication, you can comfortably wear a seatbelt, and you can safely maneuver your car and apply brakes.  You should see your doctor in the office for a follow-up appointment approximately two to three weeks after your surgery.  Make sure that you call for this appointment within a day or two after you arrive home to insure a convenient appointment time.  WHEN TO CALL YOUR DOCTOR: -- Fever greater than  101.5 -- Inability to urinate -- Nausea and/or vomiting - persistent -- Extreme swelling or bruising -- Continued bleeding from incision -- Increased pain, redness, or drainage from the incision -- Difficulty swallowing or breathing -- Muscle cramping or spasms -- Numbness or tingling in hands or around lips  The clinic staff is available to answer your questions during regular business hours.  Please don't hesitate to call and ask to speak to one of the nurses if you have concerns.  Earnstine Regal, MD, [facility] Office: [phone]  Website: [email or web address]     Increase activity slowly    Complete by:  As directed      Remove dressing in 24 hours    Complete by:  As directed             Medication List    TAKE these medications        calcium carbonate 1250 (500 Ca) MG tablet  Commonly known as:  OS-CAL - dosed in mg of elemental calcium  Take 2 tablets (1,000 mg of elemental calcium total) by mouth 2 (two) times daily with a meal.     HYDROcodone-acetaminophen 5-325 MG tablet  Commonly known as:  NORCO/VICODIN  Take 1-2 tablets by mouth every 4 (four) hours as needed for moderate pain.         Earnstine Regal, MD, [facility] Office: [phone]   Signed: Earnstine Regal 09/13/2015, 7:52 AM

## 2015-09-13 NOTE — Progress Notes (Signed)
Discharge instructions reviewed with patient, vital signs are stable, calcium is normal, patient tolerating her diet, incision within normal limits, patient to follow up with MD Jason Fila RN 9:42 AM  09-13-2015

## 2015-09-15 ENCOUNTER — Encounter (HOSPITAL_COMMUNITY): Payer: Self-pay | Admitting: Surgery

## 2015-10-31 NOTE — Addendum Note (Signed)
Encounter addended by: Ananias Pilgrim, RN on: 10/31/2015 10:23 AM<BR>     Documentation filed: Charges VN

## 2017-03-15 IMAGING — CR DG CHEST 2V
2 series · 2 of 2 positions shown · non-contrast
Comparison: None.

CLINICAL DATA: Preop for thyroid surgery.

EXAM:
CHEST  2 VIEW

[w chest pa]
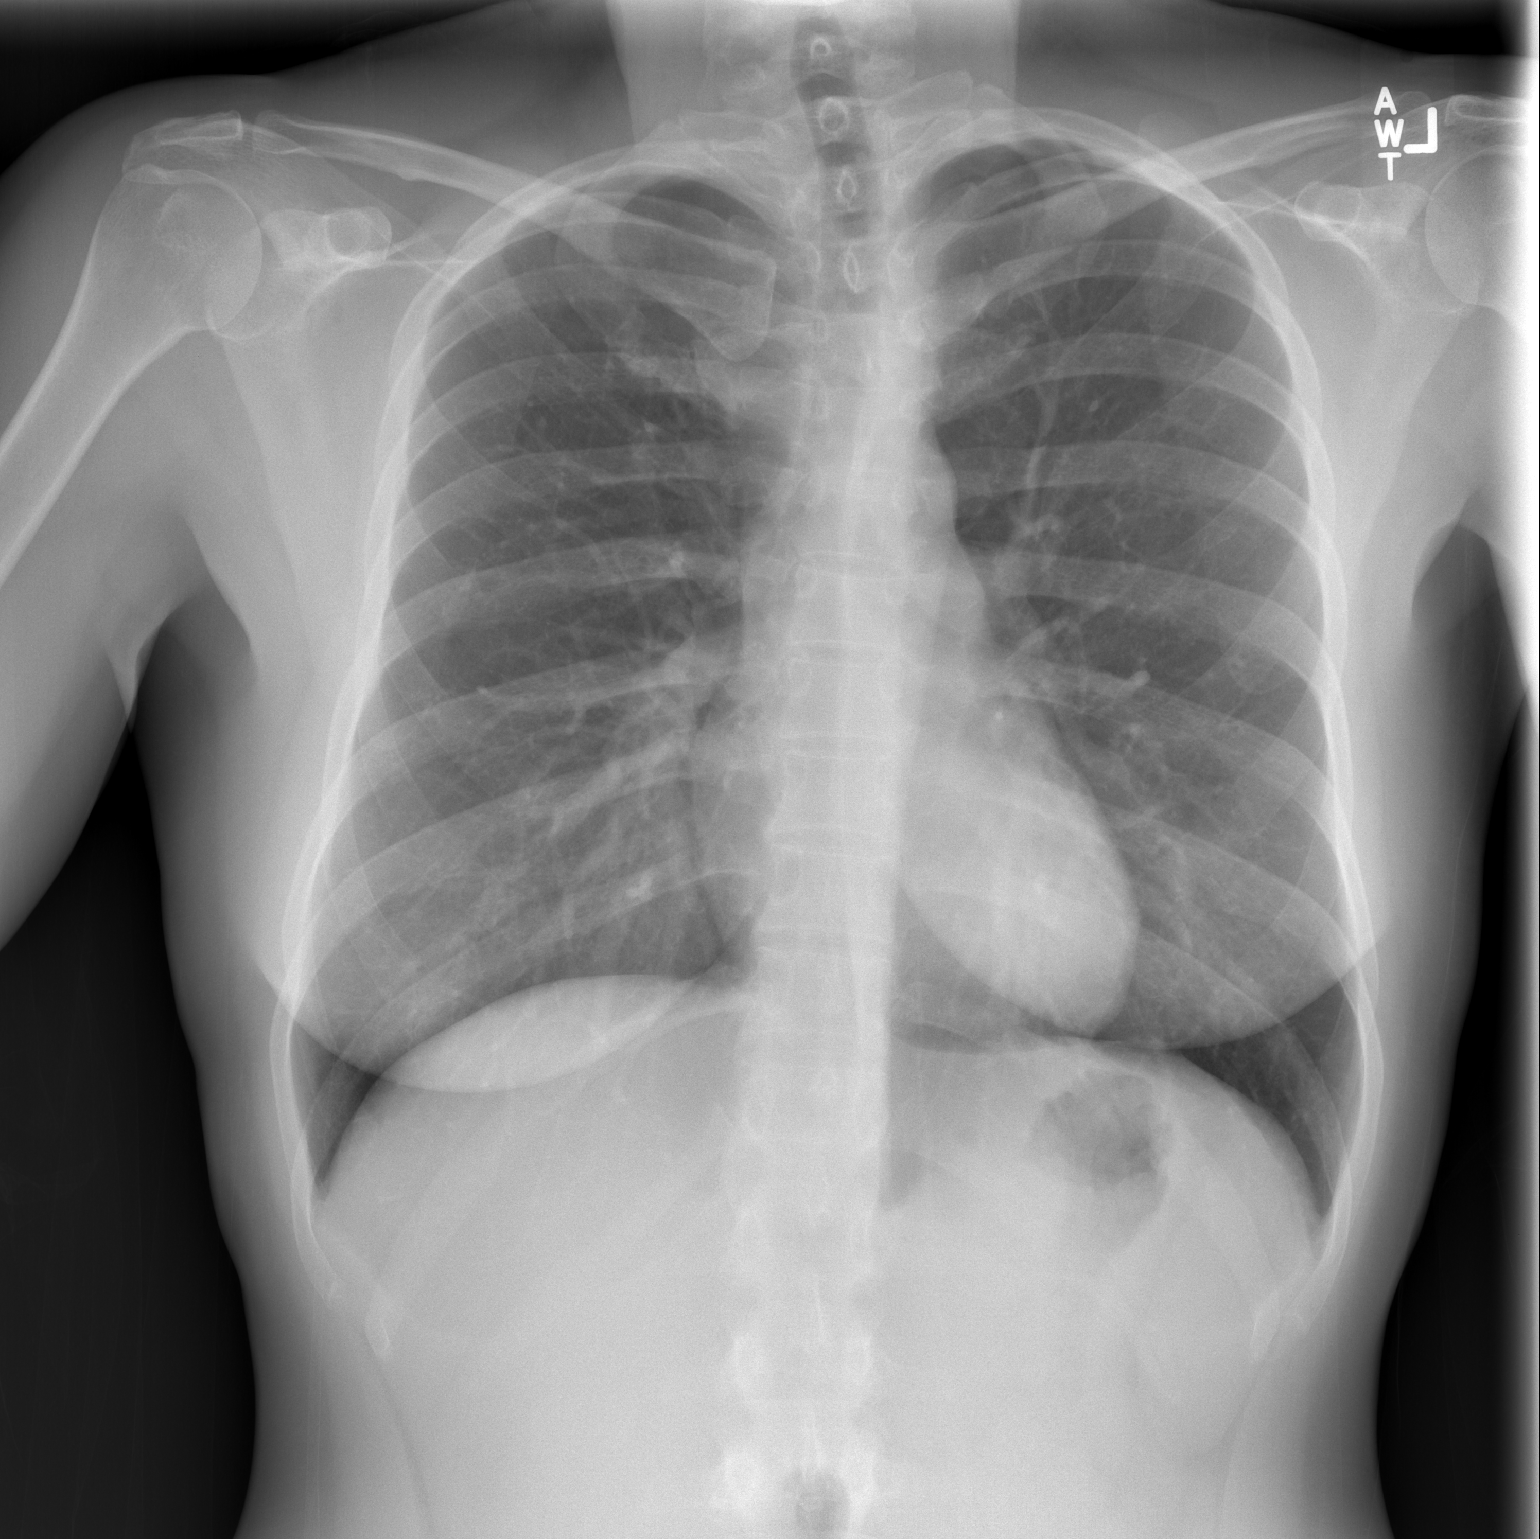

[w chest lat]
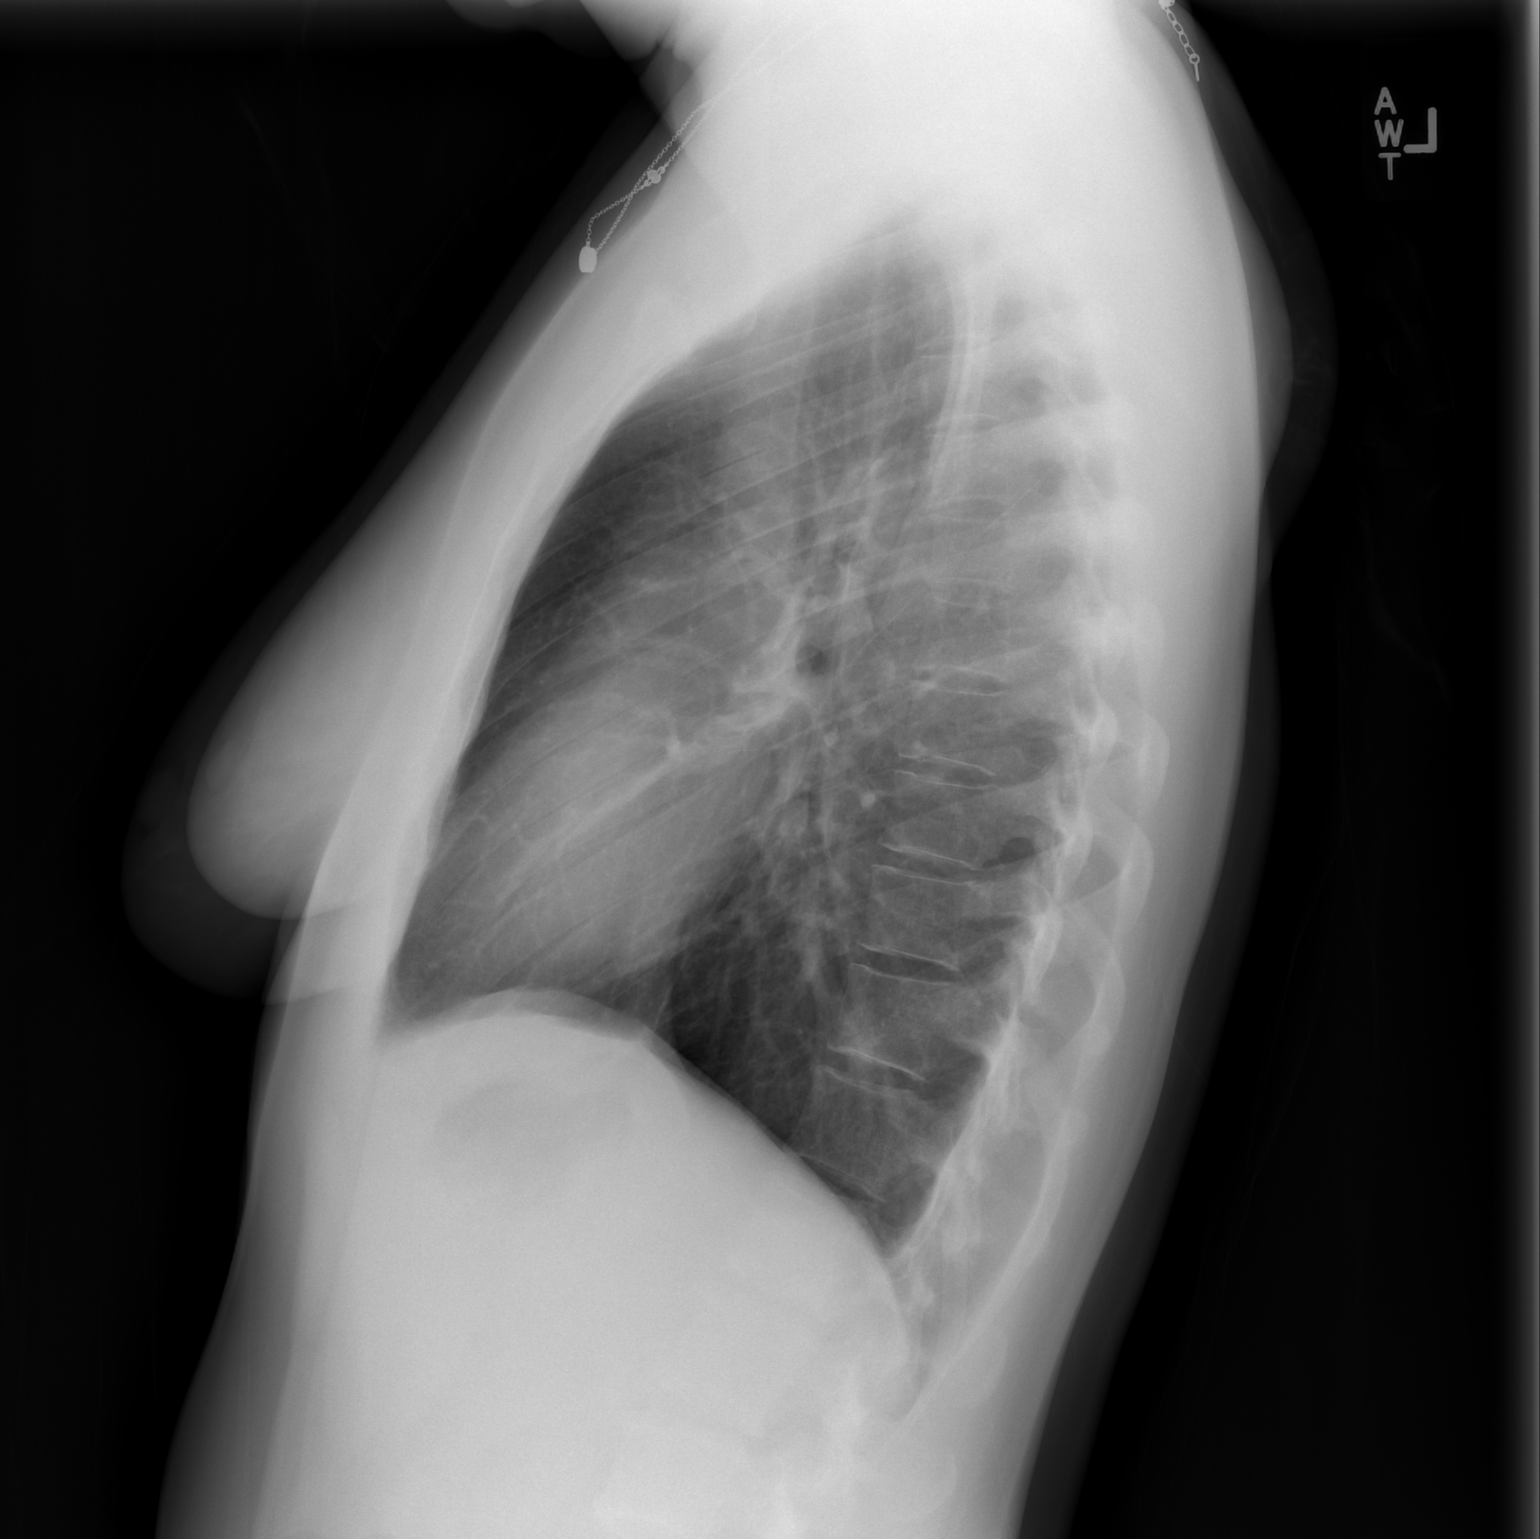

[2 of 2 positions shown; findings below may reference images not displayed]

FINDINGS: The heart size and mediastinal contours are within normal limits.
Both lungs are clear. The visualized skeletal structures are
unremarkable.
IMPRESSION: No active cardiopulmonary disease.
